# Patient Record
Sex: Male | Born: 1960 | Race: Black or African American | Hispanic: No | Marital: Single
Health system: Southern US, Community
[De-identification: ages and names within clinical notes are randomized; demographics above are authoritative.]

---

## 2010-07-13 ENCOUNTER — Emergency Department (HOSPITAL_BASED_OUTPATIENT_CLINIC_OR_DEPARTMENT_OTHER)
Admission: EM | Admit: 2010-07-13 | Discharge: 2010-07-13 | Disposition: A | Discharge status: Home / Self Care | Payer: Self-pay | Attending: Emergency Medicine | Admitting: Emergency Medicine

## 2010-07-13 ENCOUNTER — Emergency Department (INDEPENDENT_AMBULATORY_CARE_PROVIDER_SITE_OTHER): Payer: Self-pay

## 2010-07-13 DIAGNOSIS — M79609 Pain in unspecified limb: Secondary | ICD-10-CM

## 2010-07-13 DIAGNOSIS — M7730 Calcaneal spur, unspecified foot: Secondary | ICD-10-CM | POA: Insufficient documentation

## 2010-07-13 DIAGNOSIS — F17200 Nicotine dependence, unspecified, uncomplicated: Secondary | ICD-10-CM | POA: Insufficient documentation

## 2012-10-21 IMAGING — CR DG FOOT COMPLETE 3+V*R*
3 series · 3 of 3 positions shown · non-contrast
Comparison: None

CLINICAL DATA: Pain.

RIGHT FOOT COMPLETE - 3+ VIEW

[t foot ap right]
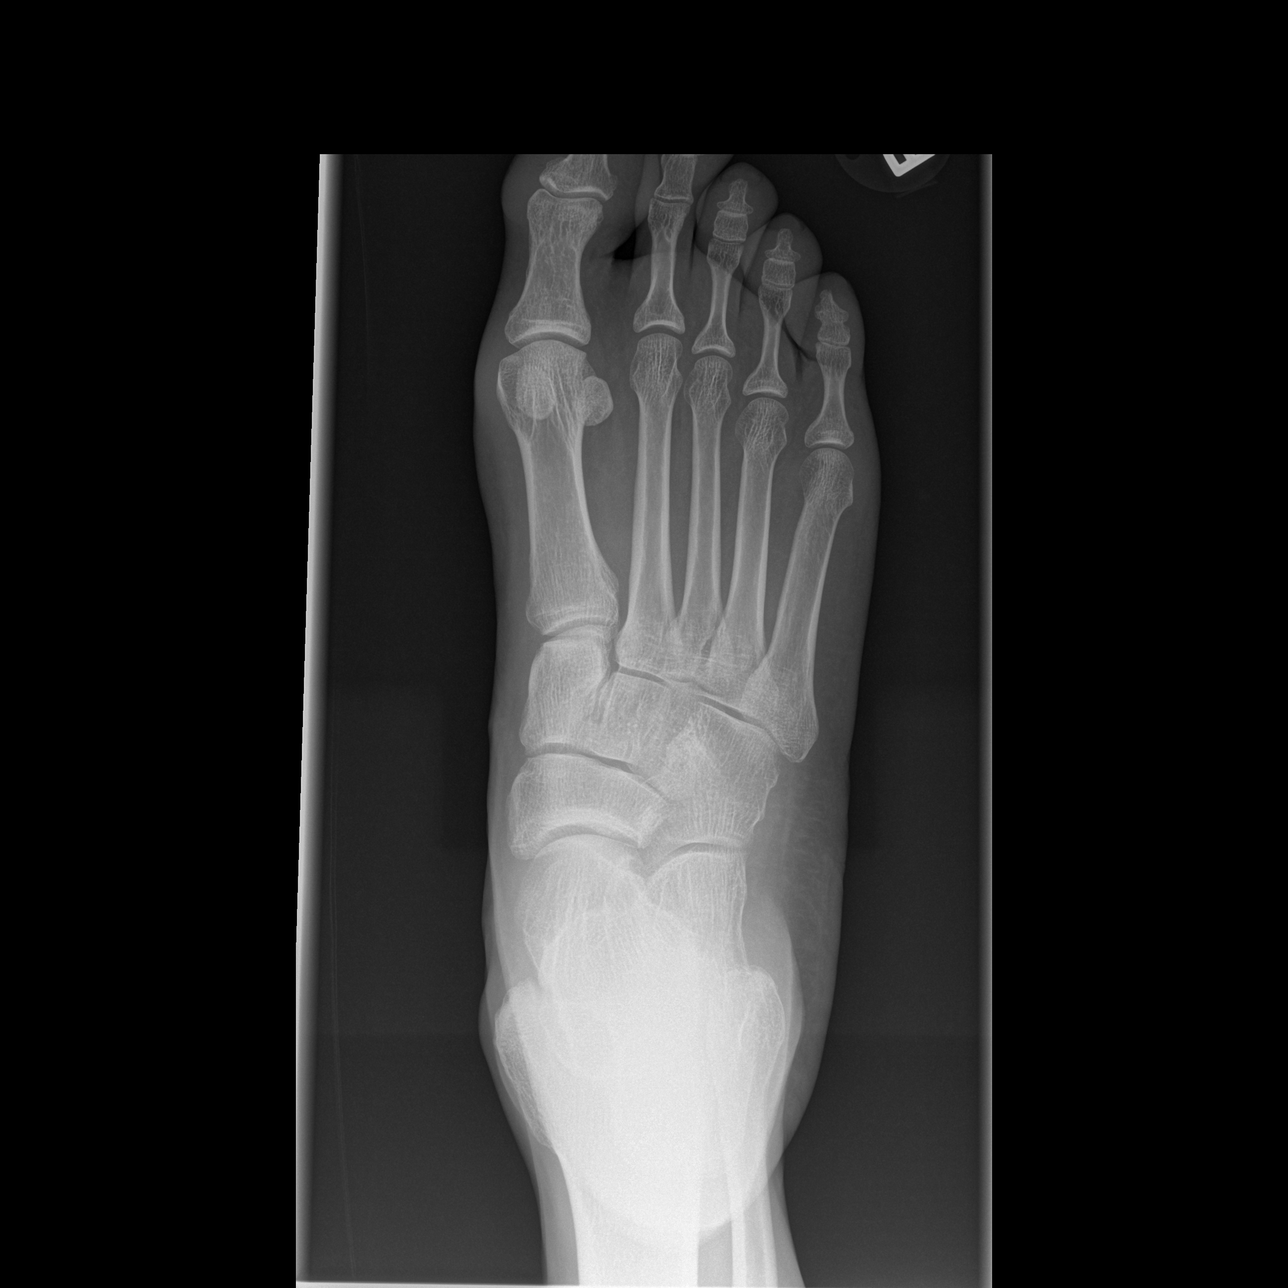

[t foot oblique right]
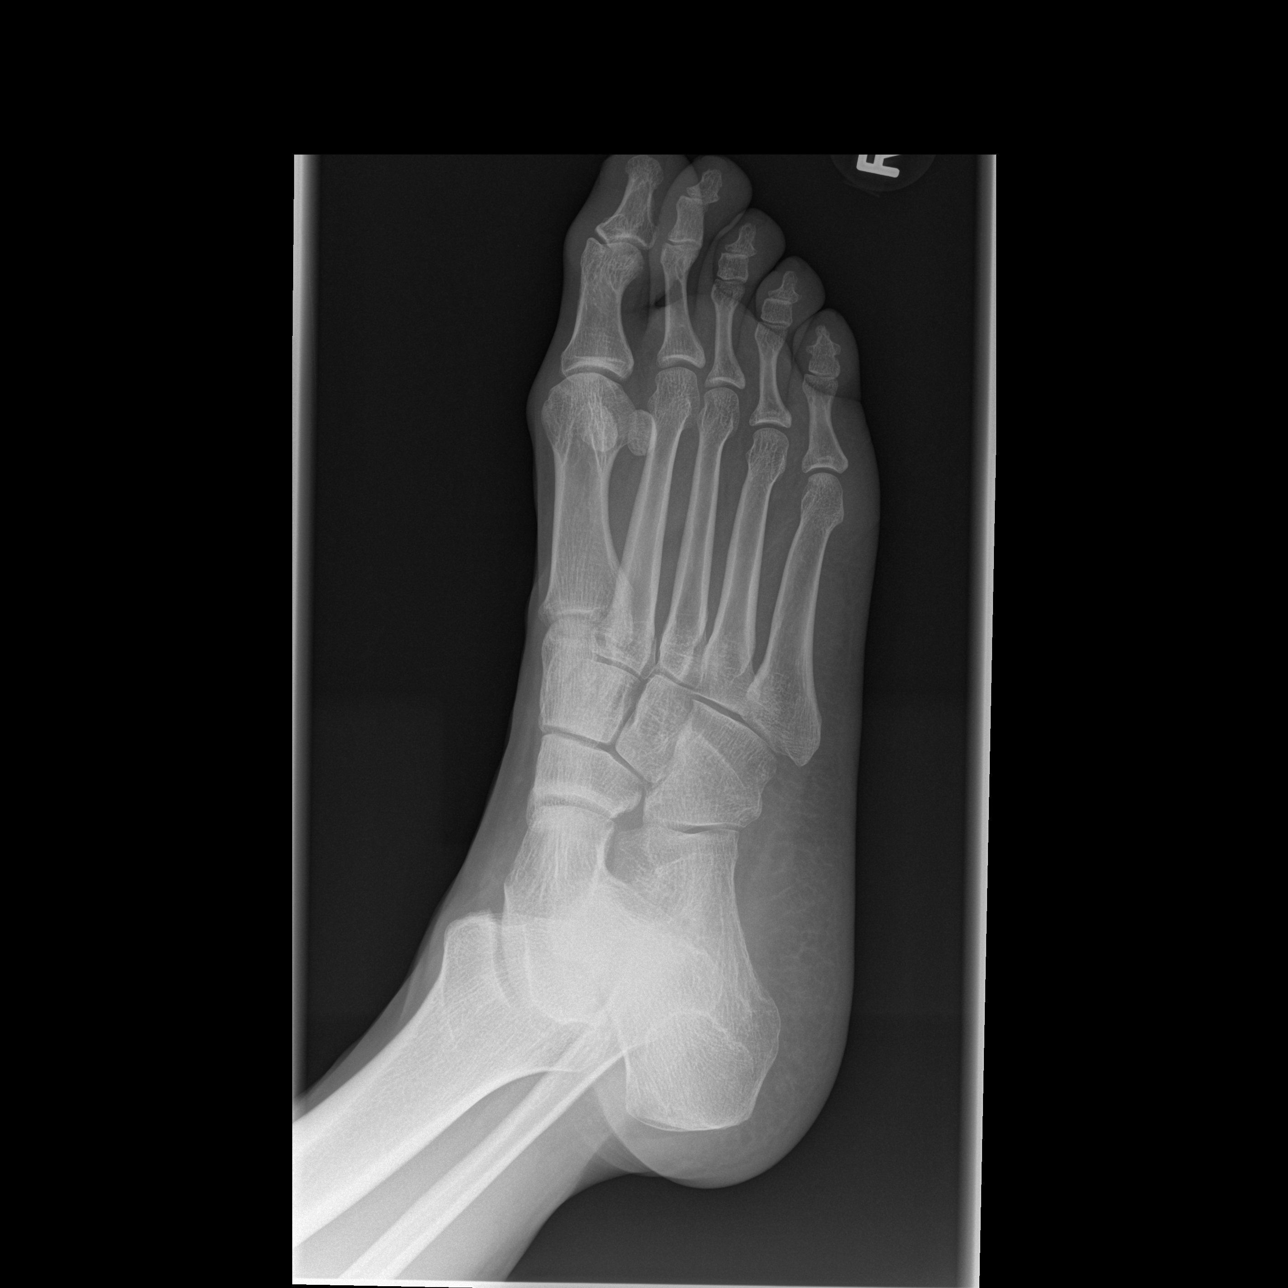

[t foot lat right]
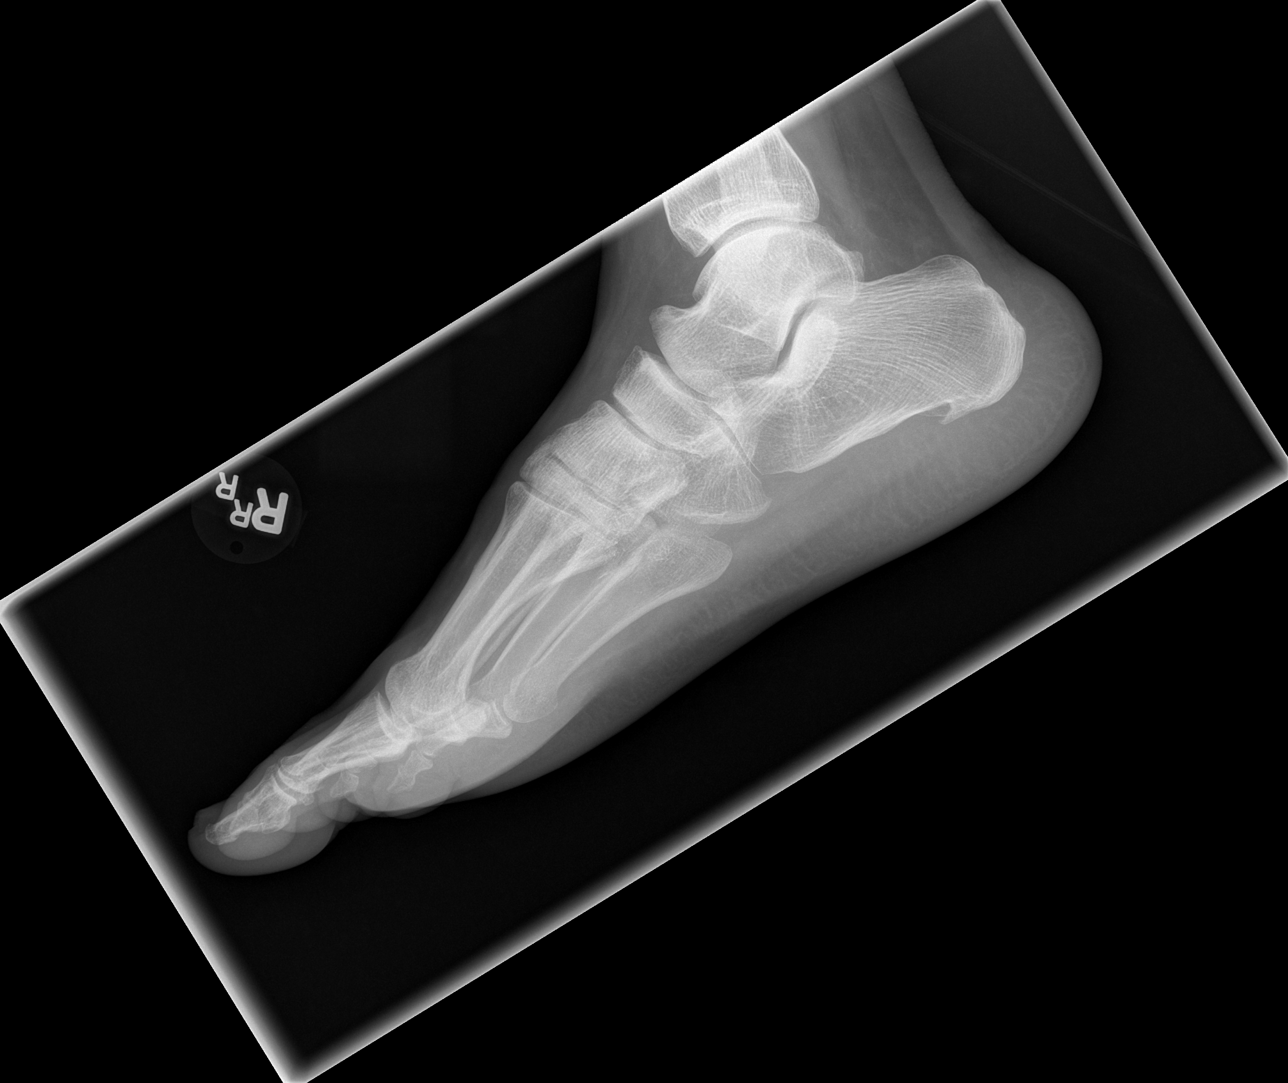

[3 of 3 positions shown; findings below may reference images not displayed]

FINDINGS: No acute bony abnormality.  Specifically, no fracture,
subluxation, or dislocation.  Soft tissues are intact.  There is a
small plantar calcaneal spur.
IMPRESSION: Small plantar calcaneal spur.  No acute findings.

## 2018-08-23 ENCOUNTER — Other Ambulatory Visit: Payer: Self-pay

## 2018-08-23 ENCOUNTER — Emergency Department (HOSPITAL_COMMUNITY)
Admission: EM | Admit: 2018-08-23 | Discharge: 2018-08-23 | Disposition: A | Discharge status: Home / Self Care | Payer: Self-pay | Attending: Emergency Medicine | Admitting: Emergency Medicine

## 2018-08-23 DIAGNOSIS — M5431 Sciatica, right side: Secondary | ICD-10-CM | POA: Insufficient documentation

## 2018-08-23 MED ORDER — OXYCODONE-ACETAMINOPHEN 5-325 MG PO TABS: 1.0000 | ORAL_TABLET | Freq: Four times a day (QID) | ORAL | 0 refills | Status: AC | PRN

## 2018-08-23 MED ORDER — OXYCODONE-ACETAMINOPHEN 5-325 MG PO TABS
1.0000 | ORAL_TABLET | Freq: Once | ORAL | Status: AC
  Administered 2018-08-23: 1 via ORAL
  Filled 2018-08-23: qty 1

## 2018-08-23 NOTE — ED Provider Notes (Signed)
[facility] Provider Note   CSN: [id number] Arrival date & time: 08/23/18  1034    History   Chief Complaint Chief Complaint  Patient presents with  . Leg Pain    HPI Guy Mullen is a 58 y.o. male.     HPI   58 year old male presents today with complaints of right leg pain.  Patient notes proximate 5 days ago he was carrying a cooler.  He notes pain in his right lower back rating to the right leg.  He notes the pain in the back is gone away but continues endorse pain in his thigh.  He was originally seen placed on prednisone given Valium, he notes these have not improved his symptoms.  He denies any loss of distal sensation strength motor function fever, trauma or any other red flags.  He notes symptoms are worse with laying and improved with standing, better with leaning forward and sitting.  He has not taken any anti-inflammatories.      No past medical history on file.  There are no active problems to display for this patient.       Home Medications    Prior to Admission medications   Medication Sig Start Date End Date Taking? Authorizing Provider  oxyCODONE-acetaminophen (PERCOCET/ROXICET) 5-325 MG tablet Take 1 tablet by mouth every 6 (six) hours as needed. 08/23/18   Okey Regal, PA-C    Family History No family history on file.  Social History Social History   Tobacco Use  . Smoking status: Not on file  Substance Use Topics  . Alcohol use: Not on file  . Drug use: Not on file     Allergies   Patient has no allergy information on record.   Review of Systems Review of Systems  All other systems reviewed and are negative.   Physical Exam Updated Vital Signs BP 137/90 (BP Location: Left Arm)   Pulse 74   Temp 98.5 F (36.9 C) (Oral)   Resp 18   SpO2 100%   Physical Exam Vitals signs and nursing note reviewed.  Constitutional:      Appearance: He is well-developed.  HENT:     Head: Normocephalic  and atraumatic.  Eyes:     General: No scleral icterus.       Right eye: No discharge.        Left eye: No discharge.     Conjunctiva/sclera: Conjunctivae normal.     Pupils: Pupils are equal, round, and reactive to light.  Neck:     Musculoskeletal: Normal range of motion.     Vascular: No JVD.     Trachea: No tracheal deviation.  Pulmonary:     Effort: Pulmonary effort is normal.     Breath sounds: No stridor.  Musculoskeletal:     Comments: No CT or L-spine tenderness palpation, lower legs bilateral without swelling edema or tenderness, straight leg positive right  Neurological:     Mental Status: He is alert and oriented to person, place, and time.     Coordination: Coordination normal.  Psychiatric:        Behavior: Behavior normal.        Thought Content: Thought content normal.        Judgment: Judgment normal.    ED Treatments / Results  Labs (all labs ordered are listed, but only abnormal results are displayed) Labs Reviewed - No data to display  EKG None  Radiology No results found.  Procedures Procedures (including critical  care time)  Medications Ordered in ED Medications  oxyCODONE-acetaminophen (PERCOCET/ROXICET) 5-325 MG per tablet 1 tablet (1 tablet Oral Given 08/23/18 1234)     Initial Impression / Assessment and Plan / ED Course  I have reviewed the triage vital signs and the nursing notes.  Pertinent labs & imaging results that were available during my care of the patient were reviewed by me and considered in my medical decision making (see chart for details).          Assessment/Plan: 58 year old male presents today with likely sciatica.  He has no red flags for back pain, he will be given pain medication encouraged use anti-inflammatories, he will discontinue prednisone.  Should return precautions given outpatient information given he verbalized understanding and agreement to today's plan had no further questions or concerns at the time of  discharge.    Final Clinical Impressions(s) / ED Diagnoses   Final diagnoses:  Sciatica of right side    ED Discharge Orders         Ordered    oxyCODONE-acetaminophen (PERCOCET/ROXICET) 5-325 MG tablet  Every 6 hours PRN     08/23/18 1303           Eyvonne MechanicHedges, Madge Therrien, PA-C 08/27/18 1315    Tegeler, Canary Brimhristopher J, MD 08/28/18 0023

## 2018-08-23 NOTE — Discharge Instructions (Addendum)
Please read attached information. If you experience any new or worsening signs or symptoms please return to the emergency room for evaluation. Please follow-up with your primary care provider or specialist as discussed. Please use medication prescribed only as directed and discontinue taking if you have any concerning signs or symptoms.   °

## 2018-08-23 NOTE — ED Triage Notes (Signed)
Pt here with c/o right leg pain , pt was seen for same on Sunday given scripts but states that they are not working
